# Patient Record
Sex: Female | Born: 1955 | Race: White | Hispanic: No | Marital: Married | Smoking: Former smoker
Health system: Southern US, Community
[De-identification: ages and names within clinical notes are randomized; demographics above are authoritative.]

## PROBLEM LIST (undated history)

## (undated) DIAGNOSIS — M8580 Other specified disorders of bone density and structure, unspecified site: Secondary | ICD-10-CM

## (undated) DIAGNOSIS — Z86010 Personal history of colonic polyps: Secondary | ICD-10-CM

## (undated) HISTORY — DX: Other specified disorders of bone density and structure, unspecified site: M85.80

## (undated) HISTORY — DX: Personal history of colonic polyps: Z86.010

---

## 1998-02-05 ENCOUNTER — Other Ambulatory Visit: Admission: RE | Admit: 1998-02-05 | Discharge: 1998-02-05 | Payer: Self-pay | Admitting: Gynecology

## 1999-02-16 HISTORY — PX: WRIST FRACTURE SURGERY: SHX121

## 1999-03-02 ENCOUNTER — Other Ambulatory Visit: Admission: RE | Admit: 1999-03-02 | Discharge: 1999-03-02 | Payer: Self-pay | Admitting: Gynecology

## 2000-05-03 ENCOUNTER — Other Ambulatory Visit: Admission: RE | Admit: 2000-05-03 | Discharge: 2000-05-03 | Payer: Self-pay | Admitting: Gynecology

## 2001-06-12 ENCOUNTER — Other Ambulatory Visit: Admission: RE | Admit: 2001-06-12 | Discharge: 2001-06-12 | Payer: Self-pay | Admitting: Gynecology

## 2001-08-24 ENCOUNTER — Emergency Department (HOSPITAL_COMMUNITY): Admission: EM | Admit: 2001-08-24 | Discharge: 2001-08-24 | Payer: Self-pay | Admitting: Emergency Medicine

## 2001-08-24 ENCOUNTER — Encounter: Payer: Self-pay | Admitting: Emergency Medicine

## 2001-08-27 ENCOUNTER — Observation Stay (HOSPITAL_COMMUNITY): Admission: EM | Admit: 2001-08-27 | Discharge: 2001-08-28 | Payer: Self-pay | Admitting: Emergency Medicine

## 2001-08-27 ENCOUNTER — Encounter: Payer: Self-pay | Admitting: Orthopedic Surgery

## 2002-06-14 ENCOUNTER — Other Ambulatory Visit: Admission: RE | Admit: 2002-06-14 | Discharge: 2002-06-14 | Payer: Self-pay | Admitting: Gynecology

## 2003-07-01 ENCOUNTER — Other Ambulatory Visit: Admission: RE | Admit: 2003-07-01 | Discharge: 2003-07-01 | Payer: Self-pay | Admitting: Gynecology

## 2004-12-28 ENCOUNTER — Other Ambulatory Visit: Admission: RE | Admit: 2004-12-28 | Discharge: 2004-12-28 | Payer: Self-pay | Admitting: Gynecology

## 2008-06-12 ENCOUNTER — Emergency Department (HOSPITAL_COMMUNITY): Admission: EM | Admit: 2008-06-12 | Discharge: 2008-06-12 | Payer: Self-pay | Admitting: Emergency Medicine

## 2010-07-03 NOTE — Op Note (Signed)
[facility]  Patient:    Brittany Russell, Brittany Russell Visit Number: [id number] MRN: [id number]          Service Type: SUR Location: 4W [id number] [id number] Attending Physician:  Dominica Severin Dictated by:   Elisha Ponder, M.D. Admit Date:  08/27/2001 Discharge Date: 08/28/2001                             Operative Report  DATE OF BIRTH:  November 30, 1935  PREOPERATIVE DIAGNOSES: 1. Displaced comminuted distal radius fracture, left upper extremity, with    bone loss. 2. Ulnar styloid fracture, left upper extremity, at the wrist level.  POSTOPERATIVE DIAGNOSES: 1. Displaced comminuted distal radius fracture, left upper extremity, with    bone loss. 2. Ulnar styloid fracture, left upper extremity, at the wrist level.  PROCEDURE: 1. Open reduction and internal fixation of left radius fracture, distal in    nature, about the wrist. 2. Allograft bone grafting left radius secondary to dorsal V defect. 3. Stress radiography. 4. Closed treatment of an ulnar styloid fracture and evaluation under    anesthesia and fluoroscopy distal radial ulnar joint. 5. Posterior interosseus nerve neurectomy. 6. Decompression of extensor pollicis longus tendon sheath/third dorsal    compartment release, left wrist.  SURGEON:  Dominica Severin, III, M.D.  ASSISTANT:  None.  COMPLICATIONS:  None.  ANESTHESIA:  General.  TOURNIQUET TIME:  Less than an hour.  DRAINS:  One.  ESTIMATED BLOOD LOSS:  Minimal.  INDICATIONS FOR PROCEDURE:  Patient is a very pleasant 55 year old female who presents with the above-mentioned diagnosis.  I have counseled her in regards to risks and benefits of surgery including the risks of infection, bleeding, anesthesia, damage to normal structures, and failure of the surgery to accomplish its intended goals of relieving symptoms and restoring function. With this in mind, she has asked to proceed.  She understands the risk of reflex sympathetic dystrophy,  stiffness, malunion, nonunion, chronic pain, etc.  All questions and indications have been discussed with the patient.  DESCRIPTION OF OPERATION:  Patient was seen by myself in anesthesia.  She underwent prophylactic antibiotics in the form of Ancef and then underwent a general anesthetic under the direction of the anesthesia department, Dr. Redgie Grayer.  Following this, she was laid supine and appropriately padded, prepped and draped in the usual sterile fashion about the left upper extremity.  Betadine scrub and paint were applied.  Following this, isolation of the extremity was performed.  The tourniquet that was previously placed was insufflated to 250 mmHg and the patient underwent a provisional reduction under fluoroscopy.  I performed a stress radiography and then placed two 0.062 K wires at the distal tip of the styloid and drilled them in a distal to proximal fashion to engage the ulnar proximal cortex of the radius.  This provided provisional fixation and highlighted the significant comminution and bone loss present.  The dorsal V defect was noted and, at this point in time, I was happy with the correction provisionally.  Following this, I made an incision about the volar radial aspect of the wrist.  Dissection was carried down to the ______ tendon sheath which was split.  The radial artery was identified and protected at all times.  The patient then had the FCR and carpal canal contents retracted ulnarly.  The radial artery was retracted radially.  The pronator was incised and lifted off in a radius to ulna direction.  Following this, adjustments were made in the reduction and the patient then underwent placement of a distal volar radius plate from hand innervations in standard ______ technique.  This was done meticulously and I assured that the patient had excellent purchase of the screws in correct position.  I maintained the position of the screws away from entering  the joint but did achieve good distal fixation so as to not allow for progressive angulatory collapse.  Once this was done, the patient had the pronator reattached with a Vicryl suture.  Following pronator reattachment, the patient had a dorsal incision made just proximal to Listers tubercle.  Dissection was carried down and the bare area was identified and a large dorsal V defect noted.  This dorsal V defect was evaluated and, following this, a large amount of allograft was placed.  This was allograft material from [facility]. The allograft material was placed tightly into the area and was prepared in sterile technique per standard protocol.  Following filling the dorsal V defect with bone graft, the patient then underwent a posterior interosseus nerve neurectomy and I did open the EPL tendon sheath to insure that this would be free from any constricting process.  I was pleased with the EPL release, bone grafting, and posterior interosseus nerve neurectomy which was accomplished with crushing and cauterization technique.  Following this, the patient then underwent evaluation of the ulnar styloid.  The patient had stability and no evidence of distal radial ulnar joint dislocation.  Once the patient had the procedures accomplished, I deflated the tourniquet, obtained hemostasis, radial artery was patent, and the wound was then closed about the volar aspect with a Prolene suture of the 4-0 variety.  A TLS drain was placed and drain was hooked up to suction.  The dorsal wound was closed with interrupted Prolene, as well.  The patient tolerated the procedure well. Final copy x-rays were taken after stress radiography revealed excellent stability.  These were saved for permanent documentation of the fixation, etc. Once this was accomplished, the patient then underwent application of a sterile dressing.  Prior to sterile dressing application, Marcaine approximately 20 cc, was placed in the  soft tissue for postoperative analgesia.  Patient was awoken from anesthesia and transferred to the recovery room in  stable condition all sponge and instrument counts were reported as correct. There were no immediate intraoperative complications.  I have discussed all issues with the patient including postoperative regimes, etc.  In the recovery room, she was awake, alert, and oriented, had excellent range of motion to the fingers, and good sensation.  There were no signs of compartment syndrome. Will continue to monitor the patient closely and proceed accordingly based upon her progress. Dictated by:   Elisha Ponder, M.D. Attending Physician:  Dominica Severin DD:  08/27/01 TD:  08/29/01 Job: [id number]

## 2011-05-17 ENCOUNTER — Encounter: Payer: Self-pay | Admitting: Internal Medicine

## 2011-07-05 ENCOUNTER — Ambulatory Visit (AMBULATORY_SURGERY_CENTER): Payer: 59 | Admitting: *Deleted

## 2011-07-05 VITALS — Ht 62.0 in | Wt 156.0 lb

## 2011-07-05 DIAGNOSIS — Z1211 Encounter for screening for malignant neoplasm of colon: Secondary | ICD-10-CM

## 2011-07-05 MED ORDER — PEG-KCL-NACL-NASULF-NA ASC-C 100 G PO SOLR: ORAL | Status: DC

## 2011-07-06 ENCOUNTER — Encounter: Payer: Self-pay | Admitting: Internal Medicine

## 2011-07-19 ENCOUNTER — Encounter: Payer: Self-pay | Admitting: Internal Medicine

## 2011-07-30 ENCOUNTER — Ambulatory Visit (AMBULATORY_SURGERY_CENTER): Payer: 59 | Admitting: Internal Medicine

## 2011-07-30 ENCOUNTER — Encounter: Payer: Self-pay | Admitting: Internal Medicine

## 2011-07-30 VITALS — BP 120/57 | HR 63 | Temp 97.7°F | Resp 31 | Ht 62.0 in | Wt 156.0 lb

## 2011-07-30 DIAGNOSIS — K5730 Diverticulosis of large intestine without perforation or abscess without bleeding: Secondary | ICD-10-CM

## 2011-07-30 DIAGNOSIS — Z1211 Encounter for screening for malignant neoplasm of colon: Secondary | ICD-10-CM

## 2011-07-30 DIAGNOSIS — D126 Benign neoplasm of colon, unspecified: Secondary | ICD-10-CM

## 2011-07-30 MED ORDER — SODIUM CHLORIDE 0.9 % IV SOLN: 500.0000 mL | INTRAVENOUS | Status: DC

## 2011-07-30 NOTE — Op Note (Signed)
[facility] [address] [place], [place]  [address]  COLONOSCOPY PROCEDURE REPORT  PATIENT:  Brittany Russell, Brittany Russell  MR#:  [id number] BIRTHDATE:  09/14/1955, 55 yrs. old  GENDER:  female ENDOSCOPIST:  Iva Boop, MD, [facility] REF. BY:  Creola Corn, M.D. PROCEDURE DATE:  07/30/2011 PROCEDURE:  Colonoscopy with biopsy ASA CLASS:  Class II INDICATIONS:  Routine Risk Screening MEDICATIONS:   These medications were titrated to patient response per physician's verbal order, MAC sedation, administered by CRNA, propofol (Diprivan) 200 mg IV  DESCRIPTION OF PROCEDURE:   After the risks benefits and alternatives of the procedure were thoroughly explained, informed consent was obtained.  Digital rectal exam was performed and revealed no abnormalities.   The LB PCF-H180AL [id number] endoscope was introduced through the anus and advanced to the cecum, which was identified by both the appendix and ileocecal valve, without limitations.  The quality of the prep was excellent, using MoviPrep.  The instrument was then slowly withdrawn as the colon was fully examined. <<PROCEDUREIMAGES>>  FINDINGS:  Two polyps were found. Diminutive ascending (2 mm) and sigmoid (3mm, within diverticulum) polyps. The polyps were removed using cold biopsy forceps.  Moderate diverticulosis was found in the sigmoid colon.  This was otherwise a normal examination of the colon. Includes right colon retroflexion.   Retroflexed views in the rectum revealed no abnormalities.    The time to cecum = 3:24 minutes. The scope was then withdrawn in 11:36 minutes from the cecum and the procedure completed. COMPLICATIONS:  None ENDOSCOPIC IMPRESSION: 1) Two diminutive polyps removed 2) Moderate diverticulosis in the sigmoid colon 3) Otherwise normal examination, excellent prep  REPEAT EXAM:  In for Colonoscopy, pending biopsy results.  Iva Boop, MD, [facility]  CC:  Creola Corn, MD and The Patient  n. eSIGNED:   Iva Boop at 07/30/2011 09:22 AM  Durene Romans, [id number]

## 2011-07-30 NOTE — Progress Notes (Signed)
The pt tolerated the colonoscopy very well. Maw   

## 2011-07-30 NOTE — Patient Instructions (Addendum)
Two small polyps were removed and diverticulosis was seen. I will let you know the pathology results and when to have another routine colonoscopy. Iva Boop, MD, FACG   YOU HAD AN ENDOSCOPIC PROCEDURE TODAY AT THE [facility]: Refer to the procedure report that was given to you for any specific questions about what was found during the examination.  If the procedure report does not answer your questions, please call your gastroenterologist to clarify.  If you requested that your care partner not be given the details of your procedure findings, then the procedure report has been included in a sealed envelope for you to review at your convenience later.  YOU SHOULD EXPECT: Some feelings of bloating in the abdomen. Passage of more gas than usual.  Walking can help get rid of the air that was put into your GI tract during the procedure and reduce the bloating. If you had a lower endoscopy (such as a colonoscopy or flexible sigmoidoscopy) you may notice spotting of blood in your stool or on the toilet paper. If you underwent a bowel prep for your procedure, then you may not have a normal bowel movement for a few days.  DIET: Your first meal following the procedure should be a light meal and then it is ok to progress to your normal diet.  A half-sandwich or bowl of soup is an example of a good first meal.  Heavy or fried foods are harder to digest and may make you feel nauseous or bloated.  Likewise meals heavy in dairy and vegetables can cause extra gas to form and this can also increase the bloating.  Drink plenty of fluids but you should avoid alcoholic beverages for 24 hours.  ACTIVITY: Your care partner should take you home directly after the procedure.  You should plan to take it easy, moving slowly for the rest of the day.  You can resume normal activity the day after the procedure however you should NOT DRIVE or use heavy machinery for 24 hours (because of the sedation medicines used  during the test).    SYMPTOMS TO REPORT IMMEDIATELY: A gastroenterologist can be reached at any hour.  During normal business hours, 8:30 AM to 5:00 PM Monday through Friday, call [phone].  After hours and on weekends, please call the GI answering service at [phone] who will take a message and have the physician on call contact you.   Following lower endoscopy (colonoscopy or flexible sigmoidoscopy):  Excessive amounts of blood in the stool  Significant tenderness or worsening of abdominal pains  Swelling of the abdomen that is new, acute  Fever of 100F or higher  FOLLOW UP: If any biopsies were taken you will be contacted by phone or by letter within the next 1-3 weeks.  Call your gastroenterologist if you have not heard about the biopsies in 3 weeks.  Our staff will call the home number listed on your records the next business day following your procedure to check on you and address any questions or concerns that you may have at that time regarding the information given to you following your procedure. This is a courtesy call and so if there is no answer at the home number and we have not heard from you through the emergency physician on call, we will assume that you have returned to your regular daily activities without incident.  SIGNATURES/CONFIDENTIALITY: You and/or your care partner have signed paperwork which will be entered into your electronic medical record.  These signatures attest to the fact that that the information above on your After Visit Summary has been reviewed and is understood.  Full responsibility of the confidentiality of this discharge information lies with you and/or your care-partner.  

## 2011-07-30 NOTE — Progress Notes (Signed)
Patient did not have preoperative order for IV antibiotic SSI prophylaxis. (G8918)  Patient did not experience any of the following events: a burn prior to discharge; a fall within the facility; wrong site/side/patient/procedure/implant event; or a hospital transfer or hospital admission upon discharge from the facility. (G8907)  

## 2011-07-30 NOTE — Progress Notes (Signed)
Abdominal pressure by the tech to aid the scope advancement. Maw   

## 2011-08-02 ENCOUNTER — Telehealth: Payer: Self-pay

## 2011-08-02 NOTE — Telephone Encounter (Signed)
  Follow up Call-  Call back number 07/30/2011  Post procedure Call Back phone  # [phone]  Permission to leave phone message Yes     Patient questions:  Do you have a fever, pain , or abdominal swelling? no Pain Score  0 *  Have you tolerated food without any problems? yes  Have you been able to return to your normal activities? yes  Do you have any questions about your discharge instructions: Diet   no Medications  no Follow up visit  no  Do you have questions or concerns about your Care? no  Actions: * If pain score is 4 or above: No action needed, pain <4.

## 2011-08-04 ENCOUNTER — Encounter: Payer: Self-pay | Admitting: Internal Medicine

## 2011-08-04 DIAGNOSIS — Z860100 Personal history of colon polyps, unspecified: Secondary | ICD-10-CM

## 2011-08-04 HISTORY — DX: Personal history of colon polyps, unspecified: Z86.0100

## 2011-08-04 HISTORY — DX: Personal history of colonic polyps: Z86.010

## 2011-08-04 NOTE — Progress Notes (Signed)
Quick Note:  Diminutive adenoma and hyperplastic polyp Routine repeat colonoscopy about 07/2016 ______

## 2012-07-06 ENCOUNTER — Other Ambulatory Visit: Payer: Self-pay | Admitting: Gynecology

## 2013-08-06 ENCOUNTER — Other Ambulatory Visit: Payer: Self-pay | Admitting: Gynecology

## 2013-08-07 LAB — CYTOLOGY - PAP

## 2014-08-26 ENCOUNTER — Other Ambulatory Visit: Payer: Self-pay | Admitting: Obstetrics & Gynecology

## 2014-08-27 LAB — CYTOLOGY - PAP

## 2016-06-30 DIAGNOSIS — Z0000 Encounter for general adult medical examination without abnormal findings: Secondary | ICD-10-CM | POA: Diagnosis not present

## 2016-06-30 DIAGNOSIS — M810 Age-related osteoporosis without current pathological fracture: Secondary | ICD-10-CM | POA: Diagnosis not present

## 2016-07-05 DIAGNOSIS — K635 Polyp of colon: Secondary | ICD-10-CM | POA: Diagnosis not present

## 2016-07-05 DIAGNOSIS — Z1389 Encounter for screening for other disorder: Secondary | ICD-10-CM | POA: Diagnosis not present

## 2016-07-05 DIAGNOSIS — R945 Abnormal results of liver function studies: Secondary | ICD-10-CM | POA: Diagnosis not present

## 2016-07-05 DIAGNOSIS — Z0000 Encounter for general adult medical examination without abnormal findings: Secondary | ICD-10-CM | POA: Diagnosis not present

## 2016-07-06 DIAGNOSIS — Z1212 Encounter for screening for malignant neoplasm of rectum: Secondary | ICD-10-CM | POA: Diagnosis not present

## 2016-08-05 ENCOUNTER — Encounter: Payer: Self-pay | Admitting: Internal Medicine

## 2016-08-06 ENCOUNTER — Encounter: Payer: Self-pay | Admitting: Internal Medicine

## 2016-08-19 DIAGNOSIS — H6121 Impacted cerumen, right ear: Secondary | ICD-10-CM | POA: Diagnosis not present

## 2016-09-27 ENCOUNTER — Encounter: Payer: Self-pay | Admitting: Internal Medicine

## 2016-09-27 ENCOUNTER — Ambulatory Visit (AMBULATORY_SURGERY_CENTER): Payer: Self-pay

## 2016-09-27 VITALS — Ht 62.0 in | Wt 191.4 lb

## 2016-09-27 DIAGNOSIS — Z860100 Personal history of colon polyps, unspecified: Secondary | ICD-10-CM

## 2016-09-27 NOTE — Progress Notes (Signed)
No allergies to eggs or soy No past problems with anesthesia No home oxygen No diet meds  Registered emmi 

## 2016-10-04 DIAGNOSIS — Z01419 Encounter for gynecological examination (general) (routine) without abnormal findings: Secondary | ICD-10-CM | POA: Diagnosis not present

## 2016-10-04 DIAGNOSIS — Z1231 Encounter for screening mammogram for malignant neoplasm of breast: Secondary | ICD-10-CM | POA: Diagnosis not present

## 2016-10-04 DIAGNOSIS — Z124 Encounter for screening for malignant neoplasm of cervix: Secondary | ICD-10-CM | POA: Diagnosis not present

## 2016-10-08 ENCOUNTER — Encounter: Payer: Self-pay | Admitting: Internal Medicine

## 2016-10-08 ENCOUNTER — Ambulatory Visit (AMBULATORY_SURGERY_CENTER): Payer: 59 | Admitting: Internal Medicine

## 2016-10-08 VITALS — BP 107/48 | HR 60 | Temp 96.9°F | Resp 14 | Ht 62.0 in | Wt 191.0 lb

## 2016-10-08 DIAGNOSIS — Z86010 Personal history of colonic polyps: Secondary | ICD-10-CM | POA: Diagnosis present

## 2016-10-08 MED ORDER — SODIUM CHLORIDE 0.9 % IV SOLN: 500.0000 mL | INTRAVENOUS | Status: DC

## 2016-10-08 NOTE — Op Note (Signed)
[facility] Patient Name: Brittany Russell Procedure Date: 10/08/2016 1:58 PM MRN: [id number] Endoscopist: Gatha Mayer , MD Age: 61 Referring MD:  Date of Birth: 26-Nov-1955 Gender: Female Account #: [id number] Procedure:                Colonoscopy Indications:              High risk colon cancer surveillance: Personal                            history of colonic polyps Medicines:                Propofol per Anesthesia, Monitored Anesthesia Care Procedure:                Pre-Anesthesia Assessment:                           - Prior to the procedure, a History and Physical                            was performed, and patient medications and                            allergies were reviewed. The patient's tolerance of                            previous anesthesia was also reviewed. The risks                            and benefits of the procedure and the sedation                            options and risks were discussed with the patient.                            All questions were answered, and informed consent                            was obtained. Prior Anticoagulants: The patient has                            taken no previous anticoagulant or antiplatelet                            agents. ASA Grade Assessment: II - A patient with                            mild systemic disease. After reviewing the risks                            and benefits, the patient was deemed in                            satisfactory condition to undergo the procedure.  After obtaining informed consent, the colonoscope                            was passed under direct vision. Throughout the                            procedure, the patient's blood pressure, pulse, and                            oxygen saturations were monitored continuously. The                            Colonoscope was introduced through the anus and                            advanced to the  the terminal ileum, with                            identification of the appendiceal orifice and IC                            valve. The quality of the bowel preparation was                            excellent. The colonoscopy was performed without                            difficulty. The patient tolerated the procedure                            well. The bowel preparation used was Miralax. The                            terminal ileum, ileocecal valve, appendiceal                            orifice, and rectum were photographed. Scope In: 2:07:04 PM Scope Out: 2:15:13 PM Scope Withdrawal Time: 0 hours 6 minutes 1 second  Total Procedure Duration: 0 hours 8 minutes 9 seconds  Findings:                 The perianal and digital rectal examinations were                            normal.                           Multiple diverticula were found in the sigmoid                            colon.                           The exam was otherwise without abnormality on  direct and retroflexion views. Complications:            No immediate complications. Estimated blood loss:                            None. Estimated Blood Loss:     Estimated blood loss: none. Impression:               - Diverticulosis in the sigmoid colon.                           - The examination was otherwise normal on direct                            and retroflexion views.                           - No specimens collected.                           - Personal history of colonic polyp - diminutive                            adenoma 2013 Recommendation:           - Repeat colonoscopy in 10 years for screening                            purposes and for surveillance.                           - Patient has a contact number available for                            emergencies. The signs and symptoms of potential                            delayed complications were discussed with the                             patient. Return to normal activities tomorrow.                            Written discharge instructions were provided to the                            patient.                           - Resume previous diet.                           - Continue present medications. Gatha Mayer, MD 10/08/2016 2:21:59 PM This report has been signed electronically.

## 2016-10-08 NOTE — Progress Notes (Signed)
To recovery, report to Scott, RN, VSS 

## 2016-10-08 NOTE — Patient Instructions (Addendum)
   No polyps today Next routine colonoscopy or other screening test in 10 years - 2028  You do not need to do tests for blood in the stool.  I appreciate the opportunity to care for you. Gatha Mayer, MD, FACG  YOU HAD AN ENDOSCOPIC PROCEDURE TODAY AT [facility]:   Refer to the procedure report that was given to you for any specific questions about what was found during the examination.  If the procedure report does not answer your questions, please call your gastroenterologist to clarify.  If you requested that your care partner not be given the details of your procedure findings, then the procedure report has been included in a sealed envelope for you to review at your convenience later.  YOU SHOULD EXPECT: Some feelings of bloating in the abdomen. Passage of more gas than usual.  Walking can help get rid of the air that was put into your GI tract during the procedure and reduce the bloating. If you had a lower endoscopy (such as a colonoscopy or flexible sigmoidoscopy) you may notice spotting of blood in your stool or on the toilet paper. If you underwent a bowel prep for your procedure, you may not have a normal bowel movement for a few days.  Please Note:  You might notice some irritation and congestion in your nose or some drainage.  This is from the oxygen used during your procedure.  There is no need for concern and it should clear up in a day or so.  SYMPTOMS TO REPORT IMMEDIATELY:   Following lower endoscopy (colonoscopy or flexible sigmoidoscopy):  Excessive amounts of blood in the stool  Significant tenderness or worsening of abdominal pains  Swelling of the abdomen that is new, acute  Fever of 100F or higher  For urgent or emergent issues, a gastroenterologist can be reached at any hour by calling [phone].   DIET:  We do recommend a small meal at first, but then you may proceed to your regular diet.  Drink plenty of fluids but you should avoid  alcoholic beverages for 24 hours.  ACTIVITY:  You should plan to take it easy for the rest of today and you should NOT DRIVE or use heavy machinery until tomorrow (because of the sedation medicines used during the test).    FOLLOW UP: Our staff will call the number listed on your records the next business day following your procedure to check on you and address any questions or concerns that you may have regarding the information given to you following your procedure. If we do not reach you, we will leave a message.  However, if you are feeling well and you are not experiencing any problems, there is no need to return our call.  We will assume that you have returned to your regular daily activities without incident.  If any biopsies were taken you will be contacted by phone or by letter within the next 1-3 weeks.  Please call us at [phone] if you have not heard about the biopsies in 3 weeks.    SIGNATURES/CONFIDENTIALITY: You and/or your care partner have signed paperwork which will be entered into your electronic medical record.  These signatures attest to the fact that that the information above on your After Visit Summary has been reviewed and is understood.  Full responsibility of the confidentiality of this discharge information lies with you and/or your care-partner.  Diverticulosis information given.  Recall colonoscopy 10 years.

## 2016-10-08 NOTE — Progress Notes (Signed)
Pt's states no medical or surgical changes since previsit or office visit. 

## 2016-10-11 ENCOUNTER — Telehealth: Payer: Self-pay

## 2016-10-11 NOTE — Telephone Encounter (Signed)
  Follow up Call-  Call back number 10/08/2016  Post procedure Call Back phone  # [phone]  Permission to leave phone message No  Some recent data might be hidden     Patient questions:  Do you have a fever, pain , or abdominal swelling? No. Pain Score  0 *  Have you tolerated food without any problems? Yes.    Have you been able to return to your normal activities? Yes.    Do you have any questions about your discharge instructions: Diet   No. Medications  No. Follow up visit  No.  Do you have questions or concerns about your Care? No.  Actions: * If pain score is 4 or above: No action needed, pain <4.

## 2016-10-16 DIAGNOSIS — Z23 Encounter for immunization: Secondary | ICD-10-CM | POA: Diagnosis not present

## 2017-06-30 DIAGNOSIS — R82998 Other abnormal findings in urine: Secondary | ICD-10-CM | POA: Diagnosis not present

## 2017-06-30 DIAGNOSIS — Z0000 Encounter for general adult medical examination without abnormal findings: Secondary | ICD-10-CM | POA: Diagnosis not present

## 2017-07-07 DIAGNOSIS — Z0000 Encounter for general adult medical examination without abnormal findings: Secondary | ICD-10-CM | POA: Diagnosis not present

## 2017-07-07 DIAGNOSIS — R945 Abnormal results of liver function studies: Secondary | ICD-10-CM | POA: Diagnosis not present

## 2017-07-07 DIAGNOSIS — K635 Polyp of colon: Secondary | ICD-10-CM | POA: Diagnosis not present

## 2017-07-07 DIAGNOSIS — Z1389 Encounter for screening for other disorder: Secondary | ICD-10-CM | POA: Diagnosis not present

## 2017-07-08 DIAGNOSIS — Z1212 Encounter for screening for malignant neoplasm of rectum: Secondary | ICD-10-CM | POA: Diagnosis not present

## 2018-03-22 DIAGNOSIS — K219 Gastro-esophageal reflux disease without esophagitis: Secondary | ICD-10-CM | POA: Diagnosis not present

## 2018-03-22 DIAGNOSIS — D860 Sarcoidosis of lung: Secondary | ICD-10-CM | POA: Diagnosis not present

## 2018-03-22 DIAGNOSIS — R05 Cough: Secondary | ICD-10-CM | POA: Diagnosis not present

## 2018-03-29 DIAGNOSIS — D860 Sarcoidosis of lung: Secondary | ICD-10-CM | POA: Diagnosis not present

## 2018-04-27 DIAGNOSIS — M810 Age-related osteoporosis without current pathological fracture: Secondary | ICD-10-CM | POA: Diagnosis not present

## 2018-06-22 DIAGNOSIS — M792 Neuralgia and neuritis, unspecified: Secondary | ICD-10-CM | POA: Diagnosis not present

## 2018-06-22 DIAGNOSIS — M542 Cervicalgia: Secondary | ICD-10-CM | POA: Diagnosis not present

## 2018-07-04 DIAGNOSIS — E7849 Other hyperlipidemia: Secondary | ICD-10-CM | POA: Diagnosis not present

## 2018-07-04 DIAGNOSIS — Z0000 Encounter for general adult medical examination without abnormal findings: Secondary | ICD-10-CM | POA: Diagnosis not present

## 2018-10-10 ENCOUNTER — Emergency Department (HOSPITAL_COMMUNITY)
Admission: EM | Admit: 2018-10-10 | Discharge: 2018-10-10 | Disposition: A | Discharge status: Home / Self Care | Payer: 59 | Attending: Emergency Medicine | Admitting: Emergency Medicine

## 2018-10-10 ENCOUNTER — Other Ambulatory Visit: Payer: Self-pay

## 2018-10-10 ENCOUNTER — Encounter (HOSPITAL_COMMUNITY): Payer: Self-pay | Admitting: Emergency Medicine

## 2018-10-10 DIAGNOSIS — Z7982 Long term (current) use of aspirin: Secondary | ICD-10-CM | POA: Diagnosis not present

## 2018-10-10 DIAGNOSIS — S0101XA Laceration without foreign body of scalp, initial encounter: Secondary | ICD-10-CM | POA: Diagnosis present

## 2018-10-10 DIAGNOSIS — Z87891 Personal history of nicotine dependence: Secondary | ICD-10-CM | POA: Insufficient documentation

## 2018-10-10 DIAGNOSIS — Y93E1 Activity, personal bathing and showering: Secondary | ICD-10-CM | POA: Insufficient documentation

## 2018-10-10 DIAGNOSIS — Y999 Unspecified external cause status: Secondary | ICD-10-CM | POA: Diagnosis not present

## 2018-10-10 DIAGNOSIS — W010XXA Fall on same level from slipping, tripping and stumbling without subsequent striking against object, initial encounter: Secondary | ICD-10-CM

## 2018-10-10 DIAGNOSIS — Y92012 Bathroom of single-family (private) house as the place of occurrence of the external cause: Secondary | ICD-10-CM | POA: Insufficient documentation

## 2018-10-10 DIAGNOSIS — W182XXA Fall in (into) shower or empty bathtub, initial encounter: Secondary | ICD-10-CM | POA: Diagnosis not present

## 2018-10-10 MED ORDER — TETANUS-DIPHTH-ACELL PERTUSSIS 5-2.5-18.5 LF-MCG/0.5 IM SUSP: 0.5000 mL | Freq: Once | INTRAMUSCULAR | Status: DC

## 2018-10-10 NOTE — ED Provider Notes (Signed)
[facility] DEPT Provider Note   CSN: [id number] Arrival date & time: 10/10/18  0020    History   Chief Complaint Chief Complaint  Patient presents with  . Head Injury    HPI Brittany Russell is a 63 y.o. female.   The history is provided by the patient.  She slipped and fell in her bathtub suffering a laceration to her occiput.  There is no loss of consciousness.  There is been no dizziness or incoordination.  There has been no visual change.  There has been no nausea or vomiting.  She is not up-to-date on tetanus immunizations.  Past Medical History:  Diagnosis Date  . Osteopenia   . Personal history of colonic polyps-adenoma 08/04/2011   Diminutive adenoma 07/2011 (and a diminutive hyperplastic polyp)    Patient Active Problem List   Diagnosis Date Noted  . Personal history of colonic polyps-adenoma 08/04/2011    Past Surgical History:  Procedure Laterality Date  . WRIST FRACTURE SURGERY  2001   left with hardware     OB History   No obstetric history on file.      Home Medications    Prior to Admission medications   Medication Sig Start Date End Date Taking? Authorizing Provider  alendronate (FOSAMAX) 70 MG tablet Take 70 mg by mouth every 7 (seven) days.  06/15/11   [provider]  aspirin 81 MG tablet Take 81 mg by mouth daily.    [provider]  BLACK COHOSH PO Take by mouth daily.    [provider]  Calcium Carbonate-Vitamin D (CALCIUM + D PO) Take 2 tablets by mouth daily.    [provider]  fish oil-omega-3 fatty acids 1000 MG capsule Take 2 g by mouth daily.    [provider]  Multiple Vitamin (MULTIVITAMIN) tablet Take 1 tablet by mouth daily.    [provider]  Nutritional Supplements (ESTROVEN) TABS Take by mouth.    [provider]  vitamin B-12 (CYANOCOBALAMIN) 500 MCG tablet Take 500 mcg by mouth daily.    [provider]  vitamin C (ASCORBIC  ACID) 250 MG tablet Take 250 mg by mouth daily.    [provider]    Family History Family History  Problem Relation Age of Onset  . Colon cancer Neg Hx   . Stomach cancer Neg Hx     Social History Social History   Tobacco Use  . Smoking status: Former Smoker    Quit date: 06/05/2010    Years since quitting: 8.3  . Smokeless tobacco: Never Used  Substance Use Topics  . Alcohol use: Yes    Comment: very, very seldom  . Drug use: No     Allergies   Patient has no known allergies.   Review of Systems Review of Systems  All other systems reviewed and are negative.    Physical Exam Updated Vital Signs BP 120/76 (BP Location: Left Arm)   Pulse 81   Temp 98 F (36.7 C) (Oral)   Resp 19   Ht 5\' 1"  (1.549 m)   Wt 73.9 kg   SpO2 99%   BMI 30.80 kg/m   Physical Exam Vitals signs and nursing note reviewed.    63 year old female, resting comfortably and in no acute distress. Vital signs are normal. Oxygen saturation is 99%, which is normal. Head is normocephalic.  1 cm laceration is present on the occiput. PERRLA, EOMI. Oropharynx is clear. Neck is nontender without  adenopathy or JVD. Back is nontender and there is no CVA tenderness. Lungs are clear without rales, wheezes, or rhonchi. Chest is nontender. Heart has regular rate and rhythm without murmur. Abdomen is soft, flat, nontender without masses or hepatosplenomegaly and peristalsis is normoactive. Extremities have no cyanosis or edema, full range of motion is present. Skin is warm and dry without rash. Neurologic: Mental status is normal, cranial nerves are intact, there are no motor or sensory deficits.  ED Treatments / Results   Procedures .Marland KitchenLaceration Repair  Date/Time: 10/10/2018 1:47 AM Performed by: Delora Fuel, MD Authorized by: Delora Fuel, MD   Consent:    Consent obtained:  Verbal   Consent given by:  Patient   Risks discussed:  Infection and pain   Alternatives discussed:  No  treatment Anesthesia (see MAR for exact dosages):    Anesthesia method:  None Laceration details:    Location:  Scalp   Scalp location:  Occipital   Length (cm):  1   Depth (mm):  3 Repair type:    Repair type:  Simple Pre-procedure details:    Preparation:  Patient was prepped and draped in usual sterile fashion Exploration:    Hemostasis achieved with:  Direct pressure   Wound exploration: entire depth of wound probed and visualized     Wound extent: no foreign bodies/material noted     Contaminated: no   Treatment:    Area cleansed with:  Saline   Amount of cleaning:  Standard Skin repair:    Repair method:  Staples   Number of staples:  2 Approximation:    Approximation:  Close Post-procedure details:    Dressing:  Open (no dressing)   Patient tolerance of procedure:  Tolerated well, no immediate complications    Medications Ordered in ED Medications - No data to display   Initial Impression / Assessment and Plan / ED Course  I have reviewed the triage vital signs and the nursing notes.  Fall at home with scalp laceration.  No evidence of significant head injury.  Shared decision-making was done involving patient, husband, and myself.  In light of no neurologic symptoms, patient not on anticoagulants or antiplatelet agents, risk of significant head injury is felt to be small enough to warrant no imaging.  Tdap booster is given and the laceration is closed with staples.  She is instructed to have staples removed in 7-10days.  Given head injury instructions.  Final Clinical Impressions(s) / ED Diagnoses   Final diagnoses:  Fall from slipping, initial encounter  Scalp laceration, initial encounter    ED Discharge Orders    None       Delora Fuel, MD [id number] [phone]

## 2018-10-10 NOTE — ED Triage Notes (Signed)
Patient here from home with complaints of head injury after fall from getting out of shower. Laceration noted to back of head. Bleeding controlled. Denies blood thinners.

## 2019-01-24 ENCOUNTER — Other Ambulatory Visit: Payer: Self-pay | Admitting: Internal Medicine

## 2019-01-24 DIAGNOSIS — Z1231 Encounter for screening mammogram for malignant neoplasm of breast: Secondary | ICD-10-CM

## 2019-11-16 DIAGNOSIS — E785 Hyperlipidemia, unspecified: Secondary | ICD-10-CM | POA: Diagnosis not present

## 2019-11-16 DIAGNOSIS — E559 Vitamin D deficiency, unspecified: Secondary | ICD-10-CM | POA: Diagnosis not present

## 2019-11-17 DIAGNOSIS — R69 Illness, unspecified: Secondary | ICD-10-CM | POA: Diagnosis not present

## 2019-11-26 DIAGNOSIS — E559 Vitamin D deficiency, unspecified: Secondary | ICD-10-CM | POA: Diagnosis not present

## 2019-11-26 DIAGNOSIS — K635 Polyp of colon: Secondary | ICD-10-CM | POA: Diagnosis not present

## 2019-11-26 DIAGNOSIS — E785 Hyperlipidemia, unspecified: Secondary | ICD-10-CM | POA: Diagnosis not present

## 2019-11-26 DIAGNOSIS — M810 Age-related osteoporosis without current pathological fracture: Secondary | ICD-10-CM | POA: Diagnosis not present

## 2019-11-26 DIAGNOSIS — R82998 Other abnormal findings in urine: Secondary | ICD-10-CM | POA: Diagnosis not present

## 2019-11-26 DIAGNOSIS — R945 Abnormal results of liver function studies: Secondary | ICD-10-CM | POA: Diagnosis not present

## 2019-11-26 DIAGNOSIS — L918 Other hypertrophic disorders of the skin: Secondary | ICD-10-CM | POA: Diagnosis not present

## 2019-11-26 DIAGNOSIS — E669 Obesity, unspecified: Secondary | ICD-10-CM | POA: Diagnosis not present

## 2019-11-26 DIAGNOSIS — N951 Menopausal and female climacteric states: Secondary | ICD-10-CM | POA: Diagnosis not present

## 2019-11-26 DIAGNOSIS — Z0000 Encounter for general adult medical examination without abnormal findings: Secondary | ICD-10-CM | POA: Diagnosis not present

## 2019-11-27 ENCOUNTER — Other Ambulatory Visit: Payer: Self-pay | Admitting: Internal Medicine

## 2019-11-27 DIAGNOSIS — R945 Abnormal results of liver function studies: Secondary | ICD-10-CM

## 2019-12-04 ENCOUNTER — Ambulatory Visit
Admission: RE | Admit: 2019-12-04 | Discharge: 2019-12-04 | Disposition: A | Discharge status: Home / Self Care | Payer: Medicare HMO | Source: Ambulatory Visit | Attending: Internal Medicine | Admitting: Internal Medicine

## 2019-12-04 DIAGNOSIS — K760 Fatty (change of) liver, not elsewhere classified: Secondary | ICD-10-CM | POA: Diagnosis not present

## 2019-12-04 DIAGNOSIS — K8020 Calculus of gallbladder without cholecystitis without obstruction: Secondary | ICD-10-CM | POA: Diagnosis not present

## 2019-12-04 DIAGNOSIS — R945 Abnormal results of liver function studies: Secondary | ICD-10-CM

## 2019-12-07 DIAGNOSIS — Z1212 Encounter for screening for malignant neoplasm of rectum: Secondary | ICD-10-CM | POA: Diagnosis not present

## 2020-01-04 DIAGNOSIS — H524 Presbyopia: Secondary | ICD-10-CM | POA: Diagnosis not present

## 2020-01-07 ENCOUNTER — Ambulatory Visit (INDEPENDENT_AMBULATORY_CARE_PROVIDER_SITE_OTHER): Payer: Medicare HMO

## 2020-01-07 ENCOUNTER — Ambulatory Visit: Payer: Medicare HMO | Admitting: Physician Assistant

## 2020-01-07 ENCOUNTER — Encounter: Payer: Self-pay | Admitting: Physician Assistant

## 2020-01-07 DIAGNOSIS — S52131A Displaced fracture of neck of right radius, initial encounter for closed fracture: Secondary | ICD-10-CM | POA: Diagnosis not present

## 2020-01-07 DIAGNOSIS — Z8739 Personal history of other diseases of the musculoskeletal system and connective tissue: Secondary | ICD-10-CM | POA: Insufficient documentation

## 2020-01-07 DIAGNOSIS — M25532 Pain in left wrist: Secondary | ICD-10-CM

## 2020-01-07 DIAGNOSIS — M25522 Pain in left elbow: Secondary | ICD-10-CM

## 2020-01-07 NOTE — Progress Notes (Signed)
Office Visit Note   Patient: Brittany Russell           Date of Birth: 04-20-1955           MRN: [id number] Visit Date: 01/07/2020              Requested by: Shon Baton, [place],  [place] [address] PCP: Shon Baton, MD   Assessment & Plan: Visit Diagnoses:  1. Closed displaced fracture of neck of right radius, initial encounter   2. Pain in left wrist     Plan: Explained to Brittany Russell that we will treat this conservatively and at this point in a sling just for comfort.  Discussed with her wearing a sling whenever she is out of the home.  She is to work on gentle range of motion of the elbow forearm wrist and hand.  No heavy lifting pushing pulling or twisting with the left arm.  See her back in just 2 weeks for repeat radiographs of the left elbow to evaluate the radial neck fracture.  Discussed with her that this should take 8 to 12 weeks to heal.  Also discussed with her the possibility of avascular necrosis of the radial head.  Follow-Up Instructions: Return in about 2 weeks (around 01/21/2020) for Radiographs.   Orders:  Orders Placed This Encounter  Procedures  . XR Wrist Complete Left  . XR Elbow 2 Views Left   No orders of the defined types were placed in this encounter.     Procedures: No procedures performed   Clinical Data: No additional findings.   Subjective: Chief Complaint  Patient presents with  . Left Elbow - Pain  . Left Wrist - Pain    HPI Brittany Russell is a 64 year old female were seen for the first time for left wrist and left elbow pain.  She has history of left wrist fracture requiring surgery in 2001.  She reports that on November 6 she tripped over a box and fell onto her left wrist and elbow.  She has had pain since then she has been taking ibuprofen for the pain no other treatments.  She denies any other injuries. Review of Systems See HPI otherwise negative or noncontributory.  Objective: Vital Signs: There were no vitals  taken for this visit.  Physical Exam General: Well-developed well-nourished female no acute distress mood affect appropriate Psych: Alert and oriented x3.  Ortho Exam Left elbow she has good range of motion the elbow but pain with supination pronation of the forearm which is near full.  There is no rashes skin lesions ulcerations over the left elbow wrist forearm or hand.  She has tenderness over the left radial head region.  Full range of motion of the fingers.  No gross deformity of the left wrist. Specialty Comments:  No specialty comments available.  Imaging: XR Elbow 2 Views Left  Result Date: 01/07/2020 Left elbow 2 views: Shows a radial neck fracture with slight displacement of the radial head posteriorly.  On the AP view the fracture appears anatomic.  There appears to be no intra-articular involvement.  No other fractures in the part of the left elbow left elbow is well located.  XR Wrist Complete Left  Result Date: 01/07/2020 Left wrist 3 views: No acute fractures.  The wrist is well located.  Status post ORIF of a left wrist fracture with retained hardware hardware failure.    PMFS History: Patient Active Problem List   Diagnosis Date Noted  .  History of osteopenia 01/07/2020  . Personal history of colonic polyps-adenoma 08/04/2011   Past Medical History:  Diagnosis Date  . Osteopenia   . Personal history of colonic polyps-adenoma 08/04/2011   Diminutive adenoma 07/2011 (and a diminutive hyperplastic polyp)    Family History  Problem Relation Age of Onset  . Colon cancer Neg Hx   . Stomach cancer Neg Hx     Past Surgical History:  Procedure Laterality Date  . WRIST FRACTURE SURGERY  2001   left with hardware   Social History   Occupational History  . Not on file  Tobacco Use  . Smoking status: Former Smoker    Quit date: 06/05/2010    Years since quitting: 9.5  . Smokeless tobacco: Never Used  Substance and Sexual Activity  . Alcohol use: Yes     Comment: very, very seldom  . Drug use: No  . Sexual activity: Not on file

## 2020-01-08 ENCOUNTER — Other Ambulatory Visit: Payer: Self-pay | Admitting: Physician Assistant

## 2020-01-08 ENCOUNTER — Telehealth: Payer: Self-pay | Admitting: Physician Assistant

## 2020-01-08 MED ORDER — ACETAMINOPHEN-CODEINE #3 300-30 MG PO TABS
1.0000 | ORAL_TABLET | ORAL | 0 refills | Status: AC | PRN
Start: 2020-01-08 — End: ?

## 2020-01-08 NOTE — Telephone Encounter (Signed)
Sent in

## 2020-01-08 NOTE — Telephone Encounter (Signed)
Patient called. She would like Tylenol #3 called in to [facility] on [address]. Her call back number is [phone]

## 2020-01-08 NOTE — Telephone Encounter (Signed)
LVM informing pt

## 2020-01-08 NOTE — Telephone Encounter (Signed)
Pt husband called asking when the medication will be sent in

## 2020-01-16 DIAGNOSIS — Z780 Asymptomatic menopausal state: Secondary | ICD-10-CM | POA: Diagnosis not present

## 2020-01-16 DIAGNOSIS — Z6835 Body mass index (BMI) 35.0-35.9, adult: Secondary | ICD-10-CM | POA: Diagnosis not present

## 2020-01-16 DIAGNOSIS — Z01419 Encounter for gynecological examination (general) (routine) without abnormal findings: Secondary | ICD-10-CM | POA: Diagnosis not present

## 2020-01-21 ENCOUNTER — Encounter: Payer: Self-pay | Admitting: Physician Assistant

## 2020-01-21 ENCOUNTER — Ambulatory Visit (INDEPENDENT_AMBULATORY_CARE_PROVIDER_SITE_OTHER): Payer: Medicare HMO

## 2020-01-21 ENCOUNTER — Ambulatory Visit: Payer: Medicare HMO | Admitting: Physician Assistant

## 2020-01-21 DIAGNOSIS — S52131A Displaced fracture of neck of right radius, initial encounter for closed fracture: Secondary | ICD-10-CM

## 2020-01-21 DIAGNOSIS — Z1231 Encounter for screening mammogram for malignant neoplasm of breast: Secondary | ICD-10-CM | POA: Diagnosis not present

## 2020-01-21 DIAGNOSIS — Z79899 Other long term (current) drug therapy: Secondary | ICD-10-CM | POA: Diagnosis not present

## 2020-01-21 DIAGNOSIS — M1612 Unilateral primary osteoarthritis, left hip: Secondary | ICD-10-CM | POA: Diagnosis not present

## 2020-01-21 NOTE — Progress Notes (Signed)
HPI: [name] returns today 4 weeks 2 days status post left elbow radial neck fracture.  States overall she is doing well.  She states that her pain is diminishing.  She has been working on gentle range of motion.  No new injuries.  Physical exam: Left elbow she has near full extension.  She has full pronation and lacks last few degrees and supination.  Full flexion of the elbow.  There is no rashes skin lesions edema or ecchymosis of the left elbow.  Slight tenderness over the radial head region with palpation.   Radiographs: Left elbow 2 views: Elbow is well located.  Radial neck fracture shows signs of early consolidation.  No change in overall position alignment.  No other fractures identified.   Impression: Left elbow radial neck fracture   Plan: She is to avoid any heavy lifting with the left arm.  She does work on gentle range of motion of the left elbow and forearm.  In 3 weeks she will begin doing some supination exercises as demonstrated to her today.  No heavy gripping with the left arm until follow-up.  We will see her back in 4 weeks at that time we will obtain 2 views of the left elbow.  She can use sling for comfort but reminded her it is best for her to be out of it is much as possible.

## 2020-01-31 DIAGNOSIS — R69 Illness, unspecified: Secondary | ICD-10-CM | POA: Diagnosis not present

## 2020-02-18 ENCOUNTER — Ambulatory Visit (INDEPENDENT_AMBULATORY_CARE_PROVIDER_SITE_OTHER): Payer: Medicare HMO

## 2020-02-18 ENCOUNTER — Ambulatory Visit: Payer: Medicare HMO | Admitting: Physician Assistant

## 2020-02-18 ENCOUNTER — Encounter: Payer: Self-pay | Admitting: Physician Assistant

## 2020-02-18 DIAGNOSIS — M25522 Pain in left elbow: Secondary | ICD-10-CM | POA: Diagnosis not present

## 2020-02-18 DIAGNOSIS — S52131A Displaced fracture of neck of right radius, initial encounter for closed fracture: Secondary | ICD-10-CM

## 2020-02-18 NOTE — Progress Notes (Signed)
HPI: [name] returns today approximately 8 weeks status post left elbow radial neck fracture. She is overall doing great. She been working on range of motion of the elbow on her own. She is having no significant pain.  Physical exam: Left elbow she has full extension full supination pronation. Full flexion of the elbow. Sensation grossly intact throughout the left hand.  Tenderness over the radial head with the palpation.  Radiographs: 2 views left elbow shows good consolidation of the radial neck fracture.  There is no change in overall position alignment.  No other fractures identified.  Elbow is well located.  Impression: Left radial neck fracture  Plan: She is activities as tolerated.  She will follow-up with [place] as needed.  Questions were encouraged and answered at length.

## 2020-05-27 DIAGNOSIS — E785 Hyperlipidemia, unspecified: Secondary | ICD-10-CM | POA: Diagnosis not present

## 2020-05-27 DIAGNOSIS — M810 Age-related osteoporosis without current pathological fracture: Secondary | ICD-10-CM | POA: Diagnosis not present

## 2020-05-27 DIAGNOSIS — R945 Abnormal results of liver function studies: Secondary | ICD-10-CM | POA: Diagnosis not present

## 2020-05-27 DIAGNOSIS — N951 Menopausal and female climacteric states: Secondary | ICD-10-CM | POA: Diagnosis not present

## 2020-05-27 DIAGNOSIS — E669 Obesity, unspecified: Secondary | ICD-10-CM | POA: Diagnosis not present

## 2020-05-27 DIAGNOSIS — K635 Polyp of colon: Secondary | ICD-10-CM | POA: Diagnosis not present

## 2020-05-27 DIAGNOSIS — L918 Other hypertrophic disorders of the skin: Secondary | ICD-10-CM | POA: Diagnosis not present

## 2020-05-27 DIAGNOSIS — E559 Vitamin D deficiency, unspecified: Secondary | ICD-10-CM | POA: Diagnosis not present

## 2020-06-17 DIAGNOSIS — E785 Hyperlipidemia, unspecified: Secondary | ICD-10-CM | POA: Diagnosis not present

## 2020-06-25 DIAGNOSIS — E785 Hyperlipidemia, unspecified: Secondary | ICD-10-CM | POA: Diagnosis not present

## 2020-07-16 DIAGNOSIS — H04123 Dry eye syndrome of bilateral lacrimal glands: Secondary | ICD-10-CM | POA: Diagnosis not present

## 2020-12-05 DIAGNOSIS — E785 Hyperlipidemia, unspecified: Secondary | ICD-10-CM | POA: Diagnosis not present

## 2020-12-05 DIAGNOSIS — E559 Vitamin D deficiency, unspecified: Secondary | ICD-10-CM | POA: Diagnosis not present

## 2020-12-12 DIAGNOSIS — E785 Hyperlipidemia, unspecified: Secondary | ICD-10-CM | POA: Diagnosis not present

## 2020-12-12 DIAGNOSIS — R945 Abnormal results of liver function studies: Secondary | ICD-10-CM | POA: Diagnosis not present

## 2020-12-12 DIAGNOSIS — E559 Vitamin D deficiency, unspecified: Secondary | ICD-10-CM | POA: Diagnosis not present

## 2020-12-12 DIAGNOSIS — E669 Obesity, unspecified: Secondary | ICD-10-CM | POA: Diagnosis not present

## 2020-12-12 DIAGNOSIS — Z0000 Encounter for general adult medical examination without abnormal findings: Secondary | ICD-10-CM | POA: Diagnosis not present

## 2020-12-12 DIAGNOSIS — K635 Polyp of colon: Secondary | ICD-10-CM | POA: Diagnosis not present

## 2020-12-12 DIAGNOSIS — R82998 Other abnormal findings in urine: Secondary | ICD-10-CM | POA: Diagnosis not present

## 2020-12-12 DIAGNOSIS — K760 Fatty (change of) liver, not elsewhere classified: Secondary | ICD-10-CM | POA: Diagnosis not present

## 2020-12-12 DIAGNOSIS — M810 Age-related osteoporosis without current pathological fracture: Secondary | ICD-10-CM | POA: Diagnosis not present

## 2020-12-12 DIAGNOSIS — Z1212 Encounter for screening for malignant neoplasm of rectum: Secondary | ICD-10-CM | POA: Diagnosis not present

## 2021-04-06 IMAGING — US US ABDOMEN LIMITED
1 series · 14 of 25 positions shown · non-contrast
Comparison: None.

CLINICAL DATA: Elevated liver function tests.

EXAM:
ULTRASOUND ABDOMEN LIMITED RIGHT UPPER QUADRANT

[Series 1: us abdomen limited · 0.17mm/px · 14 of 44 slices shown]
[im 1/44]
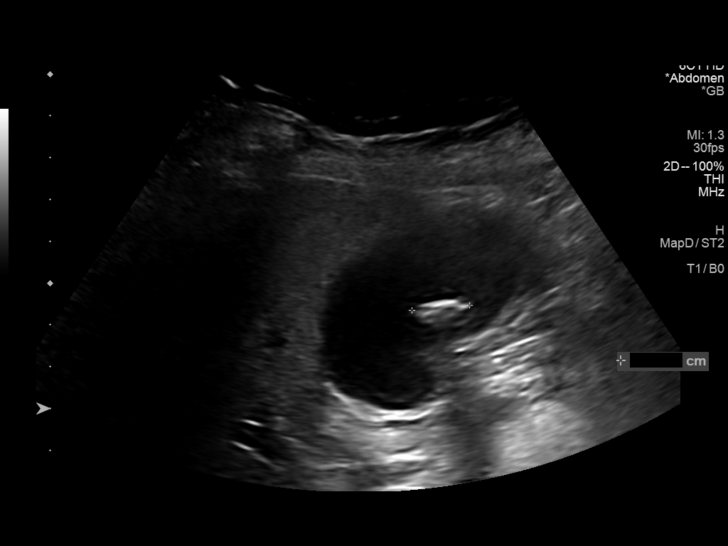
[im 4/44]
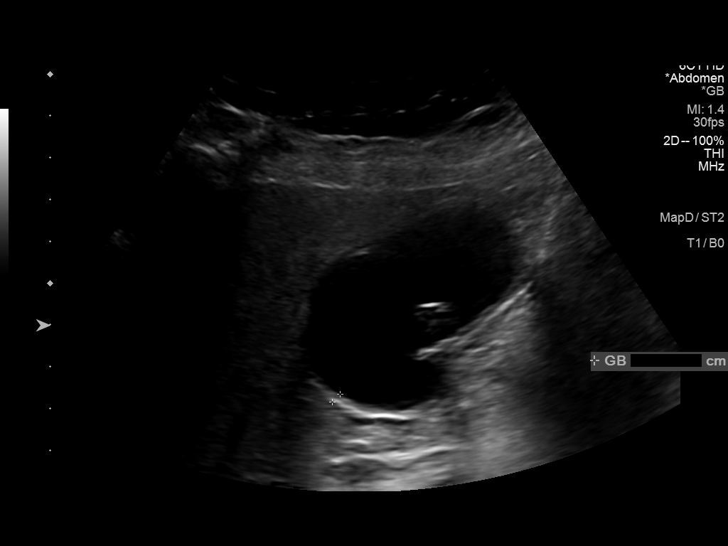
[im 8/44]
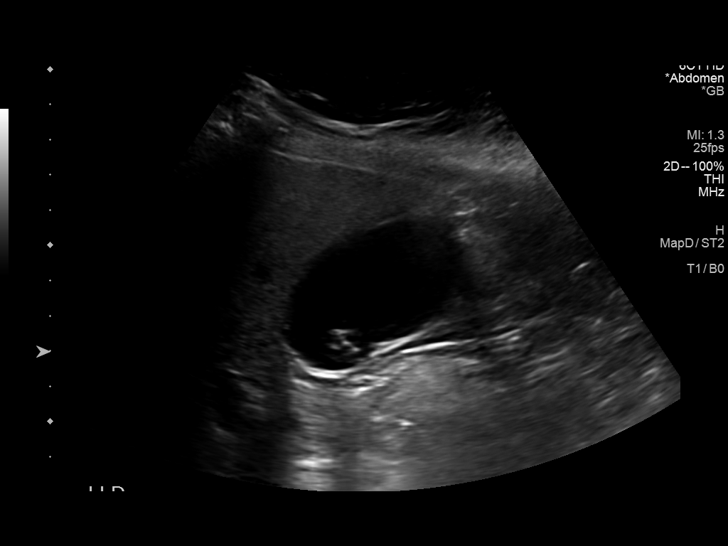
[im 11/44]
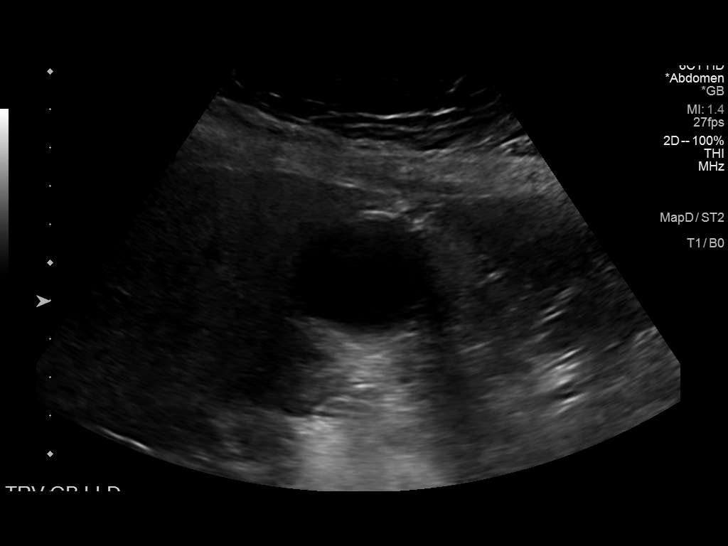
[im 15/44]
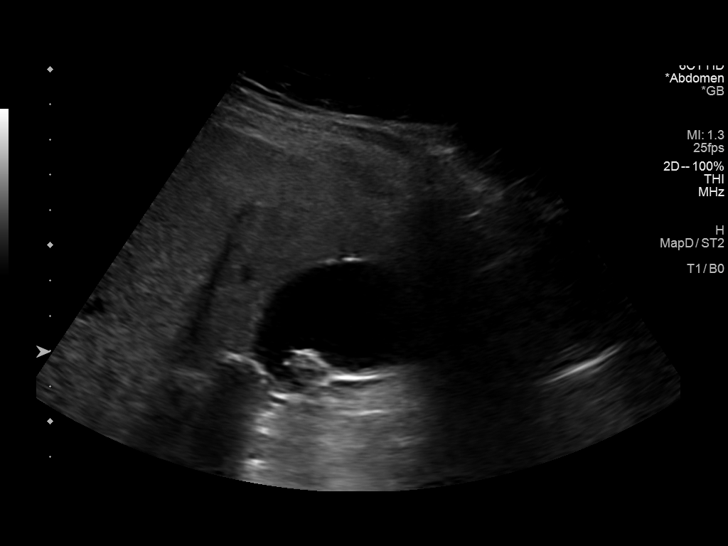
[im 17/44]
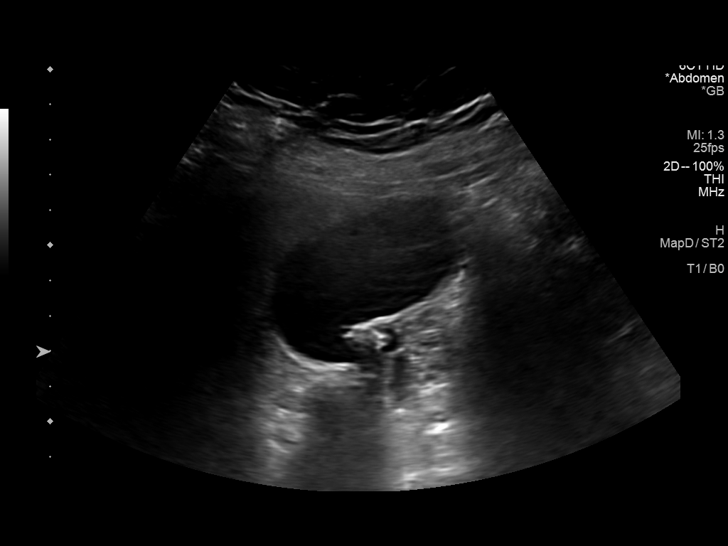
[im 20/44]
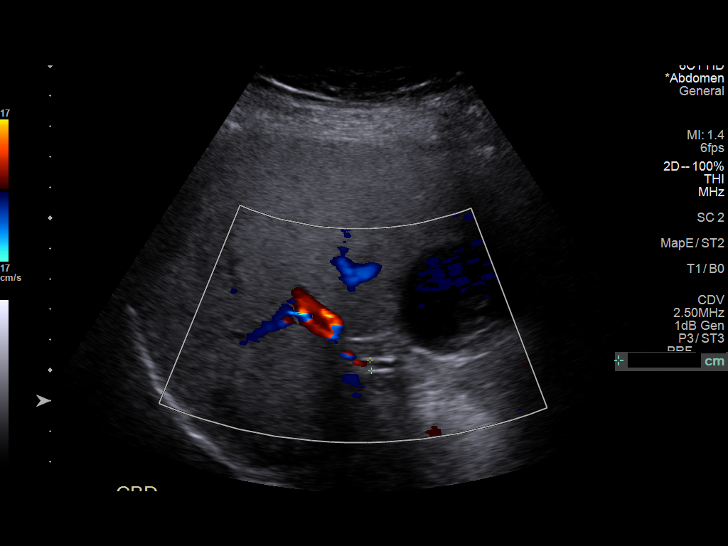
[im 24/44]
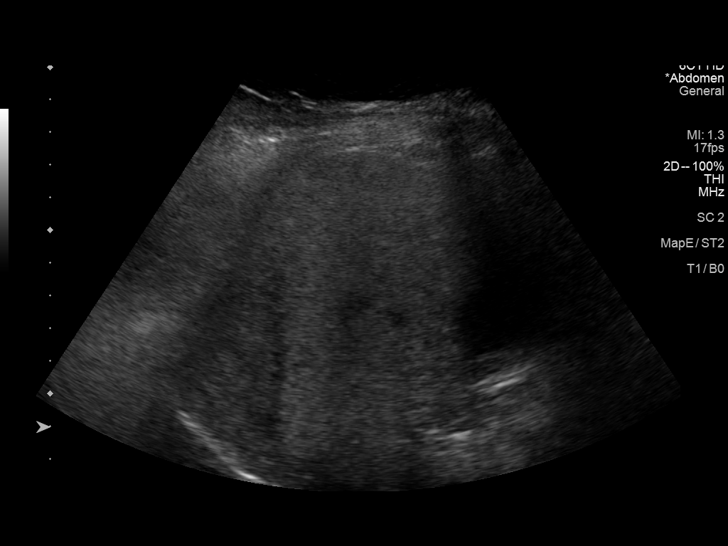
[im 27/44]
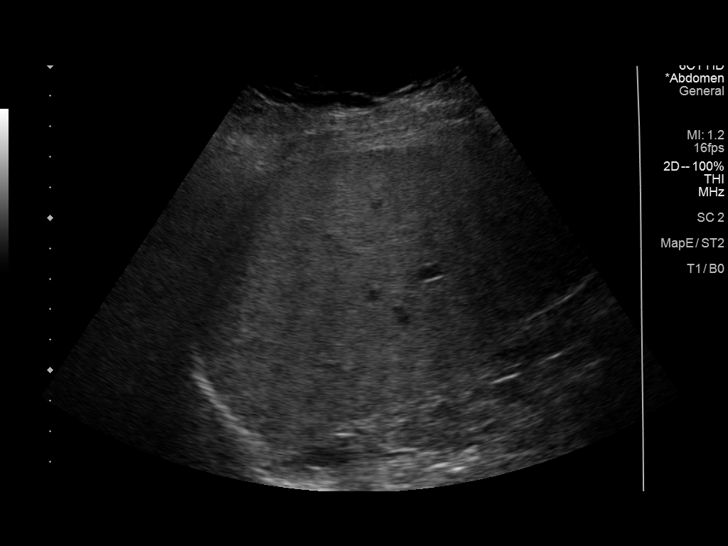
[im 29/44]
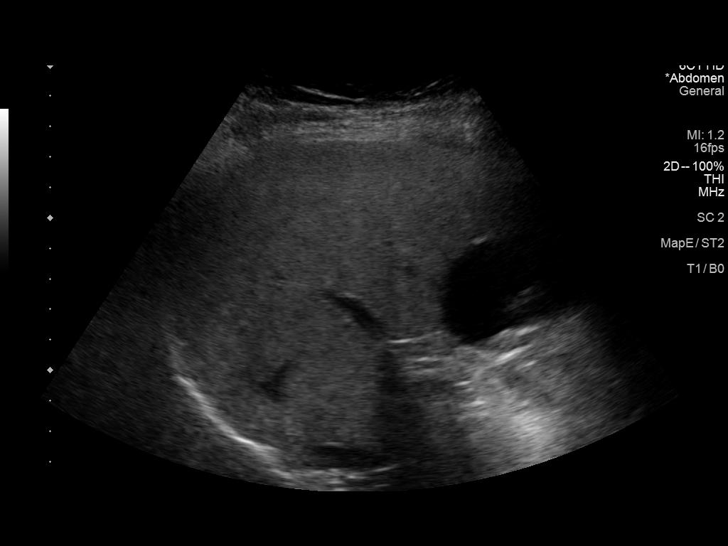
[im 33/44]
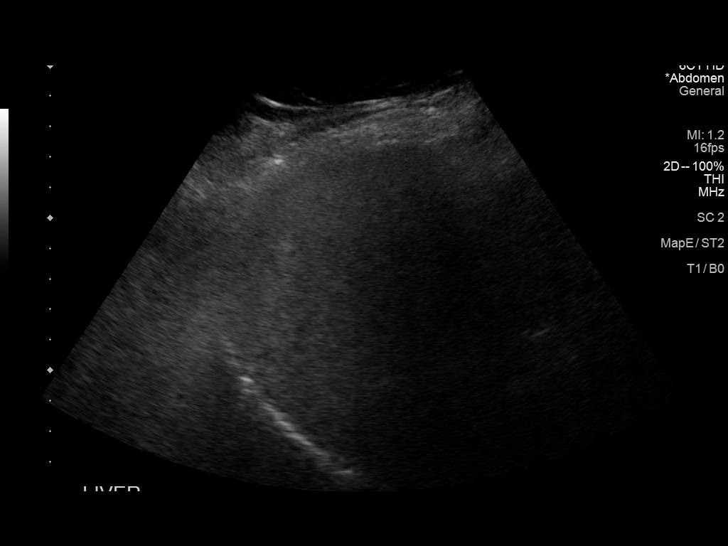
[im 36/44]
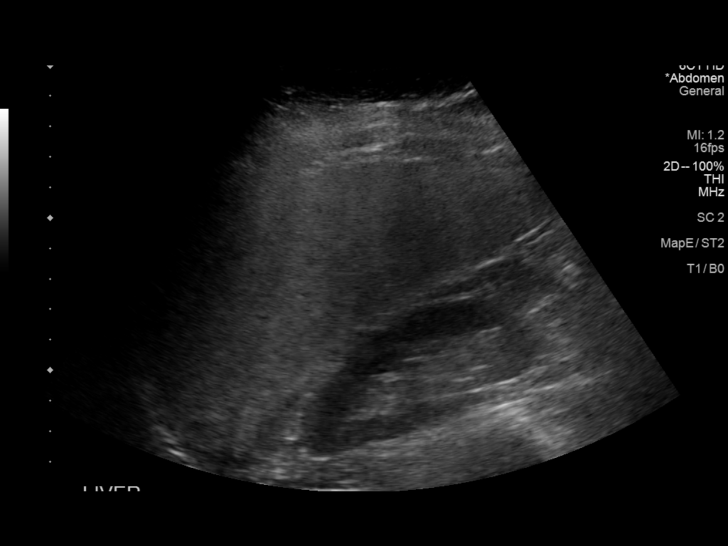
[im 40/44]
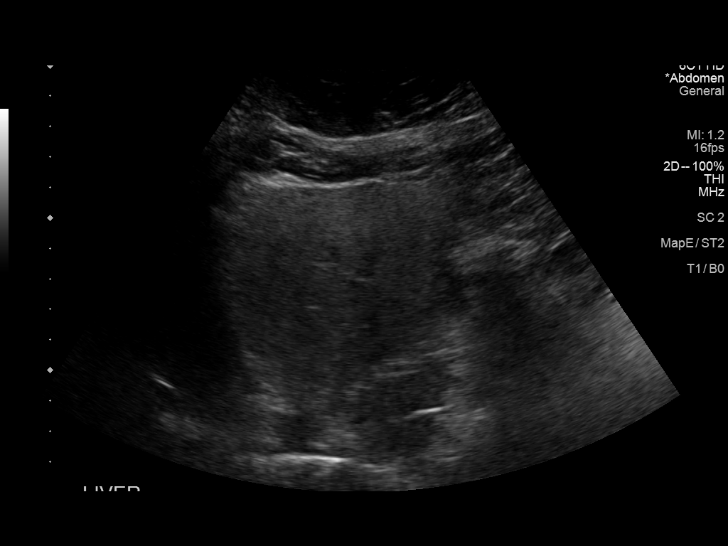
[im 44/44]
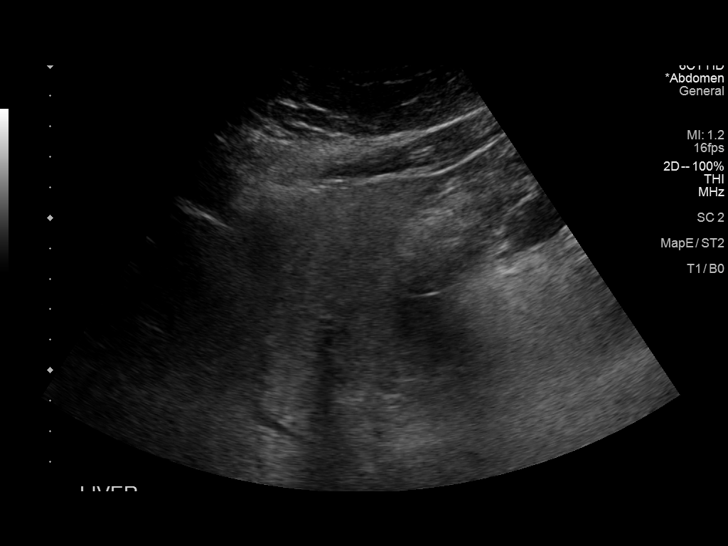

[14 of 25 positions shown; findings below may reference images not displayed]

FINDINGS: Gallbladder:

A 2.0 cm nonmobile, shadowing, echogenic gallstone is seen within
the neck of the gallbladder. An additional mobile gallstone is seen
within the gallbladder lumen. There is no evidence of gallbladder
wall thickening (2.6 mm). No sonographic Murphy sign noted by
sonographer.

Common bile duct:

Diameter: 3.5 mm

Liver:

No focal lesion identified. Heterogeneously increased echogenicity
of the liver parenchyma is noted. Portal vein is patent on color
Doppler imaging with normal direction of blood flow towards the
liver.

Other: None.
IMPRESSION: 1. Cholelithiasis, without evidence of acute cholecystitis.
2. Fatty liver.

## 2021-06-26 DIAGNOSIS — K760 Fatty (change of) liver, not elsewhere classified: Secondary | ICD-10-CM | POA: Diagnosis not present

## 2021-06-26 DIAGNOSIS — E669 Obesity, unspecified: Secondary | ICD-10-CM | POA: Diagnosis not present

## 2021-06-26 DIAGNOSIS — E559 Vitamin D deficiency, unspecified: Secondary | ICD-10-CM | POA: Diagnosis not present

## 2021-06-26 DIAGNOSIS — E785 Hyperlipidemia, unspecified: Secondary | ICD-10-CM | POA: Diagnosis not present

## 2021-06-26 DIAGNOSIS — R945 Abnormal results of liver function studies: Secondary | ICD-10-CM | POA: Diagnosis not present

## 2021-06-26 DIAGNOSIS — N951 Menopausal and female climacteric states: Secondary | ICD-10-CM | POA: Diagnosis not present

## 2021-06-26 DIAGNOSIS — M810 Age-related osteoporosis without current pathological fracture: Secondary | ICD-10-CM | POA: Diagnosis not present

## 2021-06-26 DIAGNOSIS — L918 Other hypertrophic disorders of the skin: Secondary | ICD-10-CM | POA: Diagnosis not present

## 2021-07-20 DIAGNOSIS — E785 Hyperlipidemia, unspecified: Secondary | ICD-10-CM | POA: Diagnosis not present

## 2021-07-30 DIAGNOSIS — M810 Age-related osteoporosis without current pathological fracture: Secondary | ICD-10-CM | POA: Diagnosis not present

## 2021-09-01 DIAGNOSIS — Z0100 Encounter for examination of eyes and vision without abnormal findings: Secondary | ICD-10-CM | POA: Diagnosis not present

## 2021-09-01 DIAGNOSIS — H2513 Age-related nuclear cataract, bilateral: Secondary | ICD-10-CM | POA: Diagnosis not present

## 2021-09-01 DIAGNOSIS — H04123 Dry eye syndrome of bilateral lacrimal glands: Secondary | ICD-10-CM | POA: Diagnosis not present

## 2021-12-10 DIAGNOSIS — R7989 Other specified abnormal findings of blood chemistry: Secondary | ICD-10-CM | POA: Diagnosis not present

## 2021-12-10 DIAGNOSIS — E785 Hyperlipidemia, unspecified: Secondary | ICD-10-CM | POA: Diagnosis not present

## 2021-12-10 DIAGNOSIS — E559 Vitamin D deficiency, unspecified: Secondary | ICD-10-CM | POA: Diagnosis not present

## 2021-12-24 DIAGNOSIS — E559 Vitamin D deficiency, unspecified: Secondary | ICD-10-CM | POA: Diagnosis not present

## 2021-12-24 DIAGNOSIS — Z0000 Encounter for general adult medical examination without abnormal findings: Secondary | ICD-10-CM | POA: Diagnosis not present

## 2021-12-24 DIAGNOSIS — E785 Hyperlipidemia, unspecified: Secondary | ICD-10-CM | POA: Diagnosis not present

## 2021-12-24 DIAGNOSIS — N951 Menopausal and female climacteric states: Secondary | ICD-10-CM | POA: Diagnosis not present

## 2021-12-24 DIAGNOSIS — K760 Fatty (change of) liver, not elsewhere classified: Secondary | ICD-10-CM | POA: Diagnosis not present

## 2021-12-24 DIAGNOSIS — L918 Other hypertrophic disorders of the skin: Secondary | ICD-10-CM | POA: Diagnosis not present

## 2021-12-24 DIAGNOSIS — R945 Abnormal results of liver function studies: Secondary | ICD-10-CM | POA: Diagnosis not present

## 2021-12-24 DIAGNOSIS — M810 Age-related osteoporosis without current pathological fracture: Secondary | ICD-10-CM | POA: Diagnosis not present

## 2021-12-24 DIAGNOSIS — K635 Polyp of colon: Secondary | ICD-10-CM | POA: Diagnosis not present

## 2021-12-25 ENCOUNTER — Other Ambulatory Visit: Payer: Self-pay | Admitting: Internal Medicine

## 2021-12-25 DIAGNOSIS — E785 Hyperlipidemia, unspecified: Secondary | ICD-10-CM

## 2022-01-04 DIAGNOSIS — Z1231 Encounter for screening mammogram for malignant neoplasm of breast: Secondary | ICD-10-CM | POA: Diagnosis not present

## 2022-02-01 DIAGNOSIS — R051 Acute cough: Secondary | ICD-10-CM | POA: Diagnosis not present

## 2022-02-02 ENCOUNTER — Encounter: Payer: Self-pay | Admitting: Internal Medicine

## 2022-02-03 ENCOUNTER — Other Ambulatory Visit: Payer: Medicare HMO

## 2022-03-05 ENCOUNTER — Ambulatory Visit
Admission: RE | Admit: 2022-03-05 | Discharge: 2022-03-05 | Disposition: A | Discharge status: Home / Self Care | Payer: No Typology Code available for payment source | Source: Ambulatory Visit | Attending: Internal Medicine | Admitting: Internal Medicine

## 2022-03-05 DIAGNOSIS — E785 Hyperlipidemia, unspecified: Secondary | ICD-10-CM

## 2022-10-12 DIAGNOSIS — H04123 Dry eye syndrome of bilateral lacrimal glands: Secondary | ICD-10-CM | POA: Diagnosis not present

## 2022-10-12 DIAGNOSIS — H2513 Age-related nuclear cataract, bilateral: Secondary | ICD-10-CM | POA: Diagnosis not present

## 2022-12-07 DIAGNOSIS — H2513 Age-related nuclear cataract, bilateral: Secondary | ICD-10-CM | POA: Diagnosis not present

## 2022-12-07 DIAGNOSIS — H18413 Arcus senilis, bilateral: Secondary | ICD-10-CM | POA: Diagnosis not present

## 2022-12-07 DIAGNOSIS — H2512 Age-related nuclear cataract, left eye: Secondary | ICD-10-CM | POA: Diagnosis not present

## 2022-12-07 DIAGNOSIS — H25043 Posterior subcapsular polar age-related cataract, bilateral: Secondary | ICD-10-CM | POA: Diagnosis not present

## 2022-12-07 DIAGNOSIS — H25013 Cortical age-related cataract, bilateral: Secondary | ICD-10-CM | POA: Diagnosis not present

## 2022-12-21 DIAGNOSIS — Z1389 Encounter for screening for other disorder: Secondary | ICD-10-CM | POA: Diagnosis not present

## 2022-12-21 DIAGNOSIS — E785 Hyperlipidemia, unspecified: Secondary | ICD-10-CM | POA: Diagnosis not present

## 2022-12-21 DIAGNOSIS — M810 Age-related osteoporosis without current pathological fracture: Secondary | ICD-10-CM | POA: Diagnosis not present

## 2022-12-21 DIAGNOSIS — Z1212 Encounter for screening for malignant neoplasm of rectum: Secondary | ICD-10-CM | POA: Diagnosis not present

## 2022-12-27 DIAGNOSIS — M810 Age-related osteoporosis without current pathological fracture: Secondary | ICD-10-CM | POA: Diagnosis not present

## 2022-12-27 DIAGNOSIS — Z1212 Encounter for screening for malignant neoplasm of rectum: Secondary | ICD-10-CM | POA: Diagnosis not present

## 2022-12-27 DIAGNOSIS — Z1389 Encounter for screening for other disorder: Secondary | ICD-10-CM | POA: Diagnosis not present

## 2022-12-27 DIAGNOSIS — E785 Hyperlipidemia, unspecified: Secondary | ICD-10-CM | POA: Diagnosis not present

## 2022-12-28 DIAGNOSIS — I2510 Atherosclerotic heart disease of native coronary artery without angina pectoris: Secondary | ICD-10-CM | POA: Diagnosis not present

## 2022-12-28 DIAGNOSIS — M810 Age-related osteoporosis without current pathological fracture: Secondary | ICD-10-CM | POA: Diagnosis not present

## 2022-12-28 DIAGNOSIS — R945 Abnormal results of liver function studies: Secondary | ICD-10-CM | POA: Diagnosis not present

## 2022-12-28 DIAGNOSIS — I700 Atherosclerosis of aorta: Secondary | ICD-10-CM | POA: Diagnosis not present

## 2022-12-28 DIAGNOSIS — L918 Other hypertrophic disorders of the skin: Secondary | ICD-10-CM | POA: Diagnosis not present

## 2022-12-28 DIAGNOSIS — N951 Menopausal and female climacteric states: Secondary | ICD-10-CM | POA: Diagnosis not present

## 2022-12-28 DIAGNOSIS — R82998 Other abnormal findings in urine: Secondary | ICD-10-CM | POA: Diagnosis not present

## 2022-12-28 DIAGNOSIS — Z0000 Encounter for general adult medical examination without abnormal findings: Secondary | ICD-10-CM | POA: Diagnosis not present

## 2022-12-28 DIAGNOSIS — E559 Vitamin D deficiency, unspecified: Secondary | ICD-10-CM | POA: Diagnosis not present

## 2022-12-28 DIAGNOSIS — E785 Hyperlipidemia, unspecified: Secondary | ICD-10-CM | POA: Diagnosis not present

## 2022-12-28 DIAGNOSIS — K760 Fatty (change of) liver, not elsewhere classified: Secondary | ICD-10-CM | POA: Diagnosis not present

## 2023-02-18 DIAGNOSIS — H2511 Age-related nuclear cataract, right eye: Secondary | ICD-10-CM | POA: Diagnosis not present

## 2023-02-18 DIAGNOSIS — H2512 Age-related nuclear cataract, left eye: Secondary | ICD-10-CM | POA: Diagnosis not present

## 2023-03-04 DIAGNOSIS — H25011 Cortical age-related cataract, right eye: Secondary | ICD-10-CM | POA: Diagnosis not present

## 2023-03-04 DIAGNOSIS — H2511 Age-related nuclear cataract, right eye: Secondary | ICD-10-CM | POA: Diagnosis not present

## 2023-03-04 DIAGNOSIS — H25041 Posterior subcapsular polar age-related cataract, right eye: Secondary | ICD-10-CM | POA: Diagnosis not present

## 2023-06-08 DIAGNOSIS — H04123 Dry eye syndrome of bilateral lacrimal glands: Secondary | ICD-10-CM | POA: Diagnosis not present

## 2023-06-08 DIAGNOSIS — Z0100 Encounter for examination of eyes and vision without abnormal findings: Secondary | ICD-10-CM | POA: Diagnosis not present

## 2023-06-08 DIAGNOSIS — H02403 Unspecified ptosis of bilateral eyelids: Secondary | ICD-10-CM | POA: Diagnosis not present

## 2024-01-02 DIAGNOSIS — E785 Hyperlipidemia, unspecified: Secondary | ICD-10-CM | POA: Diagnosis not present

## 2024-01-09 DIAGNOSIS — I2510 Atherosclerotic heart disease of native coronary artery without angina pectoris: Secondary | ICD-10-CM | POA: Diagnosis not present

## 2024-01-09 DIAGNOSIS — Z23 Encounter for immunization: Secondary | ICD-10-CM | POA: Diagnosis not present

## 2024-01-09 DIAGNOSIS — R82998 Other abnormal findings in urine: Secondary | ICD-10-CM | POA: Diagnosis not present

## 2024-01-09 DIAGNOSIS — R945 Abnormal results of liver function studies: Secondary | ICD-10-CM | POA: Diagnosis not present

## 2024-01-09 DIAGNOSIS — E559 Vitamin D deficiency, unspecified: Secondary | ICD-10-CM | POA: Diagnosis not present

## 2024-01-09 DIAGNOSIS — M810 Age-related osteoporosis without current pathological fracture: Secondary | ICD-10-CM | POA: Diagnosis not present

## 2024-01-09 DIAGNOSIS — Z0000 Encounter for general adult medical examination without abnormal findings: Secondary | ICD-10-CM | POA: Diagnosis not present

## 2024-01-09 DIAGNOSIS — Z1339 Encounter for screening examination for other mental health and behavioral disorders: Secondary | ICD-10-CM | POA: Diagnosis not present

## 2024-01-09 DIAGNOSIS — E785 Hyperlipidemia, unspecified: Secondary | ICD-10-CM | POA: Diagnosis not present

## 2024-01-09 DIAGNOSIS — Z1331 Encounter for screening for depression: Secondary | ICD-10-CM | POA: Diagnosis not present

## 2024-01-09 DIAGNOSIS — I700 Atherosclerosis of aorta: Secondary | ICD-10-CM | POA: Diagnosis not present

## 2024-01-09 DIAGNOSIS — K760 Fatty (change of) liver, not elsewhere classified: Secondary | ICD-10-CM | POA: Diagnosis not present

## 2024-03-01 ENCOUNTER — Other Ambulatory Visit: Payer: Self-pay

## 2024-03-01 ENCOUNTER — Encounter: Payer: Self-pay | Admitting: Physician Assistant

## 2024-03-01 ENCOUNTER — Ambulatory Visit: Admitting: Physician Assistant

## 2024-03-01 DIAGNOSIS — M25531 Pain in right wrist: Secondary | ICD-10-CM

## 2024-03-01 DIAGNOSIS — R0789 Other chest pain: Secondary | ICD-10-CM

## 2024-03-01 NOTE — Progress Notes (Signed)
 "  Office Visit Note   Patient: Brittany Russell           Date of Birth: [birth year]           MRN: [id number] Visit Date: 03/01/2024              Requested by: Onita Rush, MD [address] [place],  [place] [address] PCP: Onita Rush, MD  Chief Complaint  Patient presents with   Right Wrist - Follow-up      HPI: 69 y/o female with history of left elbow radial neck fracture 8 weeks ago in Nov due to a trip over boxes.  .  She also has a history of  left wrist fracture requiring surgery in 2001.   She is here today with complaints of right wrist  and rib pain after another fall yesterday.  She had moderate edema with bruising.  They have been applying ice prn and elevation.  She also landed on a metal bascket handle on the right side of her rib cage.  This pain has gotten better and she denies bruising.  She does not have SOB or pain with breathing.    Assessment & Plan: Visit Diagnoses:  1. Rib pain on right side   2. Pain in right wrist     Plan: right wrist contusion after a fall She will wear a wrist splint when out and about  At rest ICE and active gradual ROM of the wrist and fingers to prevent stiffness.  Elevation of the hand periodically when at rest.  Follow-Up Instructions: Return in about 3 weeks (around 03/22/2024), or if she is not trending towards getting better.SABRA Beers Exam  Patient is alert, oriented, no adenopathy, well-dressed, normal affect, normal respiratory effort. Right wrist with edema and ecchymosis Palpable radial pulse Intact flexion/extension with mild pain, sup/pronation does not cause pain. No is tender over the swollen skin, not point tenderness over the distal radial or ulnar.  Non tender to palpation proximal radial and ulnar.    Non tender to palpation over the right lateral rib cage.  No erythema or ecchymosis.  Non tender with deep inhalation.      Imaging: No fractures noted on wrist or rib x rays    Labs: No results found for:  HGBA1C, ESRSEDRATE, CRP, LABURIC, REPTSTATUS, GRAMSTAIN, CULT, LABORGA   No results found for: ALBUMIN, PREALBUMIN, CBC  No results found for: MG No results found for: VD25OH  No results found for: PREALBUMIN     No data to display           There is no height or weight on file to calculate BMI.  Orders:  Orders Placed This Encounter  Procedures   XR Wrist Complete Right   XR Ribs Unilateral Right   No orders of the defined types were placed in this encounter.    Procedures: No procedures performed  Clinical Data: No additional findings.  ROS:  All other systems negative, except as noted in the HPI. Review of Systems  Objective: Vital Signs: There were no vitals taken for this visit.  Specialty Comments:  No specialty comments available.  PMFS History: Patient Active Problem List   Diagnosis Date Noted   History of osteopenia 01/07/2020   History of colonic polyps 08/04/2011   Past Medical History:  Diagnosis Date   Osteopenia    Personal history of colonic polyps-adenoma 08/04/2011   Diminutive adenoma 07/2011 (and a diminutive hyperplastic polyp)    Family History  Problem Relation Age of Onset   Colon cancer Neg Hx    Stomach cancer Neg Hx     Past Surgical History:  Procedure Laterality Date   WRIST FRACTURE SURGERY  2001   left with hardware   Social History   Occupational History   Not on file  Tobacco Use   Smoking status: Former    Current packs/day: 0.00    Types: Cigarettes    Quit date: 06/05/2010    Years since quitting: 13.7   Smokeless tobacco: Never  Substance and Sexual Activity   Alcohol use: Yes    Comment: very, very seldom   Drug use: No   Sexual activity: Not on file       "

## 2024-03-22 ENCOUNTER — Ambulatory Visit: Admitting: Physician Assistant
